# Patient Record
Sex: Female | Born: 1985 | Hispanic: No | Marital: Married | Smoking: Never smoker
Health system: Southern US, Community
[De-identification: ages and names within clinical notes are randomized; demographics above are authoritative.]

## PROBLEM LIST (undated history)

## (undated) DIAGNOSIS — Z789 Other specified health status: Secondary | ICD-10-CM

## (undated) HISTORY — PX: NO PAST SURGERIES: SHX2092

---

## 2010-06-15 ENCOUNTER — Inpatient Hospital Stay (HOSPITAL_COMMUNITY): Admission: AD | Admit: 2010-06-15 | Discharge: 2010-06-18 | Payer: Self-pay | Admitting: Obstetrics and Gynecology

## 2010-06-15 DIAGNOSIS — O4290 Premature rupture of membranes, unspecified as to length of time between rupture and onset of labor, unspecified weeks of gestation: Secondary | ICD-10-CM

## 2011-02-03 LAB — CBC
HCT: 31.7 % — ABNORMAL LOW (ref 36.0–46.0)
Hemoglobin: 10.6 g/dL — ABNORMAL LOW (ref 12.0–15.0)
Hemoglobin: 12.4 g/dL (ref 12.0–15.0)
MCH: 31.3 pg (ref 26.0–34.0)
MCH: 31.7 pg (ref 26.0–34.0)
MCHC: 33.5 g/dL (ref 30.0–36.0)
MCHC: 33.6 g/dL (ref 30.0–36.0)
MCV: 93.1 fL (ref 78.0–100.0)
Platelets: 205 10*3/uL (ref 150–400)
RBC: 3.96 MIL/uL (ref 3.87–5.11)
RDW: 13.7 % (ref 11.5–15.5)

## 2012-04-22 ENCOUNTER — Encounter: Payer: Self-pay | Admitting: Family Medicine

## 2012-04-22 ENCOUNTER — Ambulatory Visit (INDEPENDENT_AMBULATORY_CARE_PROVIDER_SITE_OTHER): Payer: BC Managed Care – PPO | Admitting: Family Medicine

## 2012-04-22 VITALS — BP 95/57 | HR 65 | Temp 98.2°F | Resp 16 | Ht 64.0 in | Wt 133.0 lb

## 2012-04-22 DIAGNOSIS — Z0000 Encounter for general adult medical examination without abnormal findings: Secondary | ICD-10-CM

## 2012-04-22 DIAGNOSIS — R229 Localized swelling, mass and lump, unspecified: Secondary | ICD-10-CM

## 2012-04-22 LAB — POCT UA - MICROSCOPIC ONLY
Casts, Ur, LPF, POC: NEGATIVE
Crystals, Ur, HPF, POC: NEGATIVE
Yeast, UA: NEGATIVE

## 2012-04-22 LAB — POCT URINALYSIS DIPSTICK
Ketones, UA: NEGATIVE
Protein, UA: NEGATIVE
Spec Grav, UA: 1.025
pH, UA: 7

## 2012-04-22 NOTE — Patient Instructions (Signed)

## 2012-04-23 ENCOUNTER — Encounter: Payer: Self-pay | Admitting: Family Medicine

## 2012-04-23 DIAGNOSIS — Z0000 Encounter for general adult medical examination without abnormal findings: Secondary | ICD-10-CM | POA: Insufficient documentation

## 2012-04-23 NOTE — Progress Notes (Signed)
Subjective:    Patient ID: Cynthia Mccoy, female    DOB: May 14, 1986, 26 y.o.   MRN: [id number]  HPI  This 26 y.o. Female is here for CPE for insurance purposes. She had PAP Feb 2013 (normal).  She takes no chronic prescription meds and PMHx is negative. 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 Daughter. She exercises 2x/ week, is a nonsmoker and nondrinker.    Review of Systems  Constitutional: Negative.   HENT: Negative.   Respiratory: Negative.   Cardiovascular: Negative.   Gastrointestinal: Negative.   Genitourinary: Negative.        Menarche- age 26  Musculoskeletal: Negative.   Skin: Negative.   Hematological: Negative.   Psychiatric/Behavioral: Negative.        Objective:   Physical Exam  Vitals reviewed. Constitutional: She is oriented to person, place, and time. She appears well-developed and well-nourished. No distress.  HENT:  Head: Normocephalic and atraumatic.  Right Ear: External ear normal.  Left Ear: External ear normal.  Nose: Nose normal.  Eyes: Conjunctivae and EOM are normal. Pupils are equal, round, and reactive to light. No scleral icterus.  Neck: Normal range of motion. Neck supple. No thyromegaly present.  Cardiovascular: Normal rate, regular rhythm, normal heart sounds and intact distal pulses.  Exam reveals no gallop and no friction rub.   No murmur heard. Pulmonary/Chest: Effort normal and breath sounds normal. No respiratory distress. She has no wheezes.  Abdominal: Soft. Bowel sounds are normal. She exhibits no mass. There is no tenderness. There is no guarding.       No organomegaly  Musculoskeletal: Normal range of motion.  Lymphadenopathy:    She has no cervical adenopathy.  Neurological: She is alert and oriented to person, place, and time. She has normal reflexes. No cranial nerve deficit. She exhibits normal muscle tone. Coordination normal.  Skin: Skin is warm and dry. No erythema.       Right forearm: 0.5 cm firm slightly  discolored nodule, nontender, no drainage, no redness  Psychiatric: She has a normal mood and affect. Her behavior is normal. Judgment and thought content normal.   Results for orders placed in visit on 04/22/12  POCT URINALYSIS DIPSTICK      Component Value Range   Color, UA yellow     Clarity, UA clear     Glucose, UA neg     Bilirubin, UA neg     Ketones, UA neg     Spec Grav, UA 1.025     Blood, UA trace     pH, UA 7.0     Protein, UA neg     Urobilinogen, UA 0.2     Nitrite, UA neg     Leukocytes, UA small (1+)    POCT UA - MICROSCOPIC ONLY      Component Value Range   WBC, Ur, HPF, POC 0-18     RBC, urine, microscopic 0-3     Bacteria, U Microscopic trace     Mucus, UA trace     Epithelial cells, urine per micros 0-4     Crystals, Ur, HPF, POC neg     Casts, Ur, LPF, POC neg     Yeast, UA neg      LABS: Oct 23/ 2012- Chemistries- normal (Cr=0.62, Gluc= 84, LFTs normal)  Total Chol= 165  TGs= 39  HDL= 92  LDL=65      Assessment & Plan:   1. Routine general medical examination at a health care facility    2. Single skin nodule - lesion may be Dermatofibroma or Epidermal cyst Ambulatory referral to Dermatology

## 2012-04-30 ENCOUNTER — Encounter: Payer: Self-pay | Admitting: Family Medicine

## 2012-11-19 NOTE — L&D Delivery Note (Signed)
Patient was C/C/2 and pushed for 10 minutes with epidural.   NSVD  female infant, Apgars 8/9, weight pending.   The patient had a second degree lacerations repaired with 2-0 vicryl. Fundus was firm. EBL was expected. Placenta was delivered intact. Vagina was clear.  Baby was vigorous to bedside.  Cynthia Mccoy

## 2013-01-17 ENCOUNTER — Ambulatory Visit (INDEPENDENT_AMBULATORY_CARE_PROVIDER_SITE_OTHER): Payer: BC Managed Care – PPO | Admitting: Internal Medicine

## 2013-01-17 VITALS — BP 101/66 | HR 99 | Temp 98.6°F | Resp 16

## 2013-01-17 DIAGNOSIS — J029 Acute pharyngitis, unspecified: Secondary | ICD-10-CM

## 2013-01-17 DIAGNOSIS — Z3490 Encounter for supervision of normal pregnancy, unspecified, unspecified trimester: Secondary | ICD-10-CM

## 2013-01-17 DIAGNOSIS — Z331 Pregnant state, incidental: Secondary | ICD-10-CM

## 2013-01-17 NOTE — Progress Notes (Signed)
  Subjective:    Patient ID: Cynthia Mccoy, female    DOB: July 22, 1986, 27 y.o.   MRN: [id number]  HPI C/o sorethroat and low grade fever for 4 d, getting better. Her sister lives with her and had strep neg sore throat. No other sxs.   Review of Systems [redacted] weeks pregnant    Objective:   Physical Exam  Vitals reviewed. Constitutional: She is oriented to person, place, and time. She appears well-developed and well-nourished. No distress.  HENT:  Right Ear: External ear normal.  Left Ear: External ear normal.  Nose: Nose normal.  Mouth/Throat: Oropharynx is clear and moist.  Eyes: EOM are normal.  Neck: Normal range of motion.  Pulmonary/Chest: Effort normal.  Lymphadenopathy:    She has no cervical adenopathy.  Neurological: She is alert and oriented to person, place, and time.  Psychiatric: She has a normal mood and affect.   Appears well Results for orders placed in visit on 01/17/13  POCT RAPID STREP A (OFFICE)      Result Value Range   Rapid Strep A Screen Negative  Negative         Assessment & Plan:  Pharyngitis

## 2013-01-17 NOTE — Patient Instructions (Addendum)
Viral Pharyngitis  Viral pharyngitis is a viral infection that produces redness, pain, and swelling (inflammation) of the throat. It can spread from person to person (contagious).  CAUSES  Viral pharyngitis is caused by inhaling a large amount of certain germs called viruses. Many different viruses cause viral pharyngitis.  SYMPTOMS  Symptoms of viral pharyngitis include:   Sore throat.   Tiredness.   Stuffy nose.   Low-grade fever.   Congestion.   Cough.  TREATMENT  Treatment includes rest, drinking plenty of fluids, and the use of over-the-counter medication (approved by your caregiver).  HOME CARE INSTRUCTIONS    Drink enough fluids to keep your urine clear or pale yellow.   Eat soft, cold foods such as ice cream, frozen ice pops, or gelatin dessert.   Gargle with warm salt water (1 tsp salt per 1 qt of water).   If over age 7, throat lozenges may be used safely.   Only take over-the-counter or prescription medicines for pain, discomfort, or fever as directed by your caregiver. Do not take aspirin.  To help prevent spreading viral pharyngitis to others, avoid:   Mouth-to-mouth contact with others.   Sharing utensils for eating and drinking.   Coughing around others.  SEEK MEDICAL CARE IF:    You are better in a few days, then become worse.   You have a fever or pain not helped by pain medicines.   There are any other changes that concern you.  Document Released: 08/15/2005 Document Revised: 01/28/2012 Document Reviewed: 01/11/2011  ExitCare Patient Information 2013 [employer].

## 2013-05-08 LAB — OB RESULTS CONSOLE GC/CHLAMYDIA
Chlamydia: NEGATIVE
Gonorrhea: NEGATIVE

## 2013-05-08 LAB — OB RESULTS CONSOLE RUBELLA ANTIBODY, IGM: Rubella: IMMUNE

## 2013-05-08 LAB — OB RESULTS CONSOLE HIV ANTIBODY (ROUTINE TESTING): HIV: NONREACTIVE

## 2013-07-13 ENCOUNTER — Encounter (HOSPITAL_COMMUNITY): Payer: Self-pay | Admitting: *Deleted

## 2013-07-13 ENCOUNTER — Inpatient Hospital Stay (HOSPITAL_COMMUNITY): Payer: BC Managed Care – PPO | Admitting: Anesthesiology

## 2013-07-13 ENCOUNTER — Inpatient Hospital Stay (HOSPITAL_COMMUNITY)
Admission: AD | Admit: 2013-07-13 | Discharge: 2013-07-14 | DRG: 373 | Disposition: A | Discharge status: Home / Self Care | Payer: BC Managed Care – PPO | Source: Ambulatory Visit | Attending: Obstetrics and Gynecology | Admitting: Obstetrics and Gynecology

## 2013-07-13 ENCOUNTER — Encounter (HOSPITAL_COMMUNITY): Payer: Self-pay | Admitting: Anesthesiology

## 2013-07-13 LAB — CBC
HCT: 35.3 % — ABNORMAL LOW (ref 36.0–46.0)
Hemoglobin: 11.9 g/dL — ABNORMAL LOW (ref 12.0–15.0)
MCV: 84 fL (ref 78.0–100.0)
Platelets: 195 10*3/uL (ref 150–400)
RBC: 4.2 MIL/uL (ref 3.87–5.11)
WBC: 14.7 10*3/uL — ABNORMAL HIGH (ref 4.0–10.5)

## 2013-07-13 MED ORDER — ONDANSETRON HCL 4 MG/2ML IJ SOLN: 4.0000 mg | INTRAMUSCULAR | Status: DC | PRN

## 2013-07-13 MED ORDER — EPHEDRINE 5 MG/ML INJ
10.0000 mg | INTRAVENOUS | Status: DC | PRN
  Filled 2013-07-13: qty 2

## 2013-07-13 MED ORDER — BENZOCAINE-MENTHOL 20-0.5 % EX AERO
1.0000 "application " | INHALATION_SPRAY | CUTANEOUS | Status: DC | PRN
  Filled 2013-07-13: qty 56

## 2013-07-13 MED ORDER — ACETAMINOPHEN 325 MG PO TABS: 650.0000 mg | ORAL_TABLET | ORAL | Status: DC | PRN

## 2013-07-13 MED ORDER — FLEET ENEMA 7-19 GM/118ML RE ENEM: 1.0000 | ENEMA | RECTAL | Status: DC | PRN

## 2013-07-13 MED ORDER — ONDANSETRON HCL 4 MG PO TABS: 4.0000 mg | ORAL_TABLET | ORAL | Status: DC | PRN

## 2013-07-13 MED ORDER — LACTATED RINGERS IV SOLN: 500.0000 mL | Freq: Once | INTRAVENOUS | Status: DC

## 2013-07-13 MED ORDER — ZOLPIDEM TARTRATE 5 MG PO TABS: 5.0000 mg | ORAL_TABLET | Freq: Every evening | ORAL | Status: DC | PRN

## 2013-07-13 MED ORDER — EPHEDRINE 5 MG/ML INJ
10.0000 mg | INTRAVENOUS | Status: DC | PRN
  Filled 2013-07-13: qty 2
  Filled 2013-07-13: qty 4

## 2013-07-13 MED ORDER — LANOLIN HYDROUS EX OINT: TOPICAL_OINTMENT | CUTANEOUS | Status: DC | PRN

## 2013-07-13 MED ORDER — PHENYLEPHRINE 40 MCG/ML (10ML) SYRINGE FOR IV PUSH (FOR BLOOD PRESSURE SUPPORT)
80.0000 ug | PREFILLED_SYRINGE | INTRAVENOUS | Status: DC | PRN
  Filled 2013-07-13: qty 2

## 2013-07-13 MED ORDER — LIDOCAINE HCL (PF) 1 % IJ SOLN
30.0000 mL | INTRAMUSCULAR | Status: DC | PRN
  Filled 2013-07-13: qty 30

## 2013-07-13 MED ORDER — LIDOCAINE HCL (PF) 1 % IJ SOLN
INTRAMUSCULAR | Status: DC | PRN
  Administered 2013-07-13 (×2): 9 mL

## 2013-07-13 MED ORDER — LACTATED RINGERS IV SOLN: INTRAVENOUS | Status: DC

## 2013-07-13 MED ORDER — CITRIC ACID-SODIUM CITRATE 334-500 MG/5ML PO SOLN: 30.0000 mL | ORAL | Status: DC | PRN

## 2013-07-13 MED ORDER — OXYCODONE-ACETAMINOPHEN 5-325 MG PO TABS: 1.0000 | ORAL_TABLET | ORAL | Status: DC | PRN

## 2013-07-13 MED ORDER — WITCH HAZEL-GLYCERIN EX PADS: 1.0000 "application " | MEDICATED_PAD | CUTANEOUS | Status: DC | PRN

## 2013-07-13 MED ORDER — LACTATED RINGERS IV SOLN: 500.0000 mL | INTRAVENOUS | Status: DC | PRN

## 2013-07-13 MED ORDER — FENTANYL 2.5 MCG/ML BUPIVACAINE 1/10 % EPIDURAL INFUSION (WH - ANES)
14.0000 mL/h | INTRAMUSCULAR | Status: DC | PRN
  Filled 2013-07-13: qty 125

## 2013-07-13 MED ORDER — DIPHENHYDRAMINE HCL 25 MG PO CAPS: 25.0000 mg | ORAL_CAPSULE | Freq: Four times a day (QID) | ORAL | Status: DC | PRN

## 2013-07-13 MED ORDER — PHENYLEPHRINE 40 MCG/ML (10ML) SYRINGE FOR IV PUSH (FOR BLOOD PRESSURE SUPPORT)
80.0000 ug | PREFILLED_SYRINGE | INTRAVENOUS | Status: DC | PRN
  Filled 2013-07-13: qty 5
  Filled 2013-07-13: qty 2

## 2013-07-13 MED ORDER — ONDANSETRON HCL 4 MG/2ML IJ SOLN: 4.0000 mg | Freq: Four times a day (QID) | INTRAMUSCULAR | Status: DC | PRN

## 2013-07-13 MED ORDER — FENTANYL 2.5 MCG/ML BUPIVACAINE 1/10 % EPIDURAL INFUSION (WH - ANES)
INTRAMUSCULAR | Status: DC | PRN
  Administered 2013-07-13: 14 mL/h via EPIDURAL

## 2013-07-13 MED ORDER — PRENATAL MULTIVITAMIN CH
1.0000 | ORAL_TABLET | Freq: Every day | ORAL | Status: DC
  Filled 2013-07-13: qty 1

## 2013-07-13 MED ORDER — OXYTOCIN BOLUS FROM INFUSION: 500.0000 mL | INTRAVENOUS | Status: DC

## 2013-07-13 MED ORDER — DIBUCAINE 1 % RE OINT
1.0000 "application " | TOPICAL_OINTMENT | RECTAL | Status: DC | PRN
  Filled 2013-07-13: qty 28

## 2013-07-13 MED ORDER — TETANUS-DIPHTH-ACELL PERTUSSIS 5-2.5-18.5 LF-MCG/0.5 IM SUSP
0.5000 mL | Freq: Once | INTRAMUSCULAR | Status: DC
  Filled 2013-07-13: qty 0.5

## 2013-07-13 MED ORDER — SENNOSIDES-DOCUSATE SODIUM 8.6-50 MG PO TABS
2.0000 | ORAL_TABLET | Freq: Every day | ORAL | Status: DC
Start: 2013-07-13 — End: 2013-07-14
  Administered 2013-07-13: 2 via ORAL

## 2013-07-13 MED ORDER — IBUPROFEN 600 MG PO TABS: 600.0000 mg | ORAL_TABLET | Freq: Four times a day (QID) | ORAL | Status: DC | PRN

## 2013-07-13 MED ORDER — IBUPROFEN 600 MG PO TABS
600.0000 mg | ORAL_TABLET | Freq: Four times a day (QID) | ORAL | Status: DC
  Administered 2013-07-13 – 2013-07-14 (×4): 600 mg via ORAL
  Filled 2013-07-13 (×4): qty 1

## 2013-07-13 MED ORDER — SIMETHICONE 80 MG PO CHEW: 80.0000 mg | CHEWABLE_TABLET | ORAL | Status: DC | PRN

## 2013-07-13 MED ORDER — DIPHENHYDRAMINE HCL 50 MG/ML IJ SOLN: 12.5000 mg | INTRAMUSCULAR | Status: DC | PRN

## 2013-07-13 MED ORDER — OXYTOCIN 40 UNITS IN LACTATED RINGERS INFUSION - SIMPLE MED
62.5000 mL/h | INTRAVENOUS | Status: DC
  Filled 2013-07-13: qty 1000

## 2013-07-13 NOTE — Anesthesia Procedure Notes (Signed)
Epidural Patient location during procedure: OB Start time: 07/13/2013 11:20 AM End time: 07/13/2013 11:25 AM  Staffing Anesthesiologist: Sandrea Hughs Performed by: anesthesiologist   Preanesthetic Checklist Completed: patient identified, surgical consent, pre-op evaluation, timeout performed, IV checked, risks and benefits discussed and monitors and equipment checked  Epidural Patient position: sitting Prep: site prepped and draped and DuraPrep Patient monitoring: continuous pulse ox and blood pressure Approach: midline Injection technique: LOR air  Needle:  Needle type: Tuohy  Needle gauge: 17 G Needle length: 9 cm and 9 Needle insertion depth: 6 cm Catheter type: closed end flexible Catheter size: 19 Gauge Catheter at skin depth: 11 cm Test dose: negative and Other  Assessment Sensory level: T9 Events: blood not aspirated, injection not painful, no injection resistance, negative IV test and no paresthesia  Additional Notes Reason for block:procedure for pain

## 2013-07-13 NOTE — H&P (Signed)
27 y.o. [redacted]w[redacted]d  G2P1001 comes in c/o labor.  Otherwise has good fetal movement and no bleeding.  History reviewed. No pertinent past medical history. History reviewed. No pertinent past surgical history.  OB History  Gravida Para Term Preterm AB SAB TAB Ectopic Multiple Living  2 1 1  0 0 0 0 0 0 1    # Outcome Date GA Lbr Len/2nd Weight Sex Delivery Anes PTL Lv  2 CUR           1 TRM 06/16/10    F SVD EPI N Y      History   Social History  . Marital Status: Married    Spouse Name: N/A    Number of Children: 1  . Years of Education: 16   Occupational History  . 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 Smoking status: Never Smoker   . Smokeless tobacco: Not on file  . Alcohol Use: No  . Drug Use: No  . Sexual Activity: Yes    Birth Control/ Protection: None   Other Topics Concern  . Not on file   Social History Narrative  . No narrative on file   Review of patient's allergies indicates no known allergies.    Prenatal Transfer Tool  Maternal Diabetes: No Genetic Screening: Normal Maternal Ultrasounds/Referrals: Normal Fetal Ultrasounds or other Referrals:  None Maternal Substance Abuse:  No Significant Maternal Medications:  None Significant Maternal Lab Results: Lab values include: Group B Strep negative  Other PNC: uncomplicated    Filed Vitals:   07/13/13 1307  BP: 121/84  Pulse: 69  Temp:   Resp: 16     Lungs/Cor:  NAD Abdomen:  soft, gravid Ex:  no cords, erythema SVE:  5/100 per MAU FHTs:  145, good STV, NST R Toco:  q2-5   A/P   Admit to L&D for labor  GBS neg  Epidural  Routine care  [place], Luther Parody

## 2013-07-13 NOTE — Progress Notes (Signed)
Dr Claiborne Billings notified of pt's VE, FHR pattern, contraction pattern orders received to give pt a choice to ambulate and recheck cervix or go home until contraction get worse.

## 2013-07-13 NOTE — Anesthesia Preprocedure Evaluation (Signed)
Anesthesia Evaluation  Patient identified by MRN, date of birth, ID band Patient awake    Reviewed: Allergy & Precautions, H&P , NPO status , Patient's Chart, lab work & pertinent test results  Airway Mallampati: I TM Distance: >3 FB Neck ROM: full    Dental no notable dental hx.    Pulmonary neg pulmonary ROS,    Pulmonary exam normal       Cardiovascular negative cardio ROS      Neuro/Psych negative neurological ROS  negative psych ROS   GI/Hepatic negative GI ROS, Neg liver ROS,   Endo/Other  negative endocrine ROS  Renal/GU negative Renal ROS  negative genitourinary   Musculoskeletal negative musculoskeletal ROS (+)   Abdominal Normal abdominal exam  (+)   Peds  Hematology negative hematology ROS (+)   Anesthesia Other Findings   Reproductive/Obstetrics (+) Pregnancy                           Anesthesia Physical  Anesthesia Plan  ASA: II  Anesthesia Plan: Epidural   Post-op Pain Management:    Induction:   Airway Management Planned:   Additional Equipment:   Intra-op Plan:   Post-operative Plan:   Informed Consent: I have reviewed the patients History and Physical, chart, labs and discussed the procedure including the risks, benefits and alternatives for the proposed anesthesia with the patient or authorized representative who has indicated his/her understanding and acceptance.     Plan Discussed with:   Anesthesia Plan Comments:         Anesthesia Quick Evaluation  

## 2013-07-13 NOTE — MAU Note (Signed)
Pt presents with complaints of contractions that started in there middle of the night and have gotten worse this morning and are now 5 mins apart and she noticed some bloody show. States baby is active

## 2013-07-13 NOTE — MAU Note (Signed)
Pt states ctx's q4-5 minutes apart, notes bloody show.

## 2013-07-14 LAB — CBC
Hemoglobin: 10.2 g/dL — ABNORMAL LOW (ref 12.0–15.0)
MCHC: 32.2 g/dL (ref 30.0–36.0)
RDW: 14.7 % (ref 11.5–15.5)
WBC: 11.8 10*3/uL — ABNORMAL HIGH (ref 4.0–10.5)

## 2013-07-14 MED ORDER — OXYCODONE-ACETAMINOPHEN 5-325 MG PO TABS: 2.0000 | ORAL_TABLET | ORAL | Status: DC | PRN

## 2013-07-14 MED ORDER — DOCUSATE SODIUM 100 MG PO CAPS: 100.0000 mg | ORAL_CAPSULE | Freq: Two times a day (BID) | ORAL | Status: DC

## 2013-07-14 MED ORDER — IBUPROFEN 600 MG PO TABS: 600.0000 mg | ORAL_TABLET | Freq: Four times a day (QID) | ORAL | Status: DC | PRN

## 2013-07-14 NOTE — Progress Notes (Signed)
Post Partum Day 1 Subjective: no complaints, up ad lib, voiding and tolerating PO  Objective: Blood pressure 101/66, pulse 68, temperature 98.2 F (36.8 C), temperature source Oral, resp. rate 18, height 5\' 5"  (1.651 m), weight 75.297 kg (166 lb), last menstrual period 10/17/2012, SpO2 99.00%, unknown if currently breastfeeding.  Physical Exam:  General: alert, cooperative and appears stated age Lochia: appropriate Uterine Fundus: firm  Recent Labs  07/13/13 1030 07/14/13 0605  HGB 11.9* 10.2*  HCT 35.3* 31.7*    Assessment/Plan: Discharge home, Breastfeeding and Circumcision prior to discharge R/B/A reviewed, will proceed   LOS: 1 day   Gladyes Kudo H. 07/14/2013, 8:53 AM

## 2013-07-14 NOTE — Progress Notes (Signed)
  Mother called to make sure the infant's umbilical cord gets cut off. They were a early DC today but I forgot to take the clamp off. Mother stated that she had a ped appt tomorrow for a weight check anyway. I specifically told her to make sure they take the umbilical cord off in the office. She verbalized understanding.

## 2013-07-14 NOTE — Anesthesia Postprocedure Evaluation (Signed)
Anesthesia Post Note  Patient: Cynthia Mccoy  Procedure(s) Performed: * No procedures listed *  Anesthesia type: Epidural  Patient location: Mother/Baby  Post pain: Pain level controlled  Post assessment: Post-op Vital signs reviewed  Last Vitals:  Filed Vitals:   07/14/13 0600  BP: 101/66  Pulse: 68  Temp: 36.8 C  Resp: 18    Post vital signs: Reviewed  Level of consciousness:alert  Complications: No apparent anesthesia complications

## 2013-07-23 NOTE — Discharge Summary (Signed)
Obstetric Discharge Summary Reason for Admission: onset of labor Prenatal Procedures: ultrasound Intrapartum Procedures: spontaneous vaginal delivery Postpartum Procedures: none Complications-Operative and Postpartum: none Hemoglobin  Date Value Range Status  07/14/2013 10.2* 12.0 - 15.0 g/dL Final     HCT  Date Value Range Status  07/14/2013 31.7* 36.0 - 46.0 % Final    Physical Exam:  General: alert, cooperative and appears stated age 27: appropriate Uterine Fundus: firm  Discharge Diagnoses: Term Pregnancy-delivered  Discharge Information: Date: 07/23/2013 Activity: pelvic rest Diet: routine Medications: Ibuprofen, Colace and Percocet Condition: improved Instructions: refer to practice specific booklet Discharge to: home   Newborn Data: Live born female  Birth Weight: 7 lb 13 oz (3544 g) APGAR: 9, 9  Home with mother.  Derrel Moore H. 07/23/2013, 12:49 PM

## 2014-09-20 ENCOUNTER — Encounter (HOSPITAL_COMMUNITY): Payer: Self-pay | Admitting: *Deleted

## 2014-11-19 NOTE — L&D Delivery Note (Signed)
Delivery Note  SVD viable female Apgars 9,9 over intact perineum.  Placenta delivered spontaneously intact with 3VC. good support and hemostasis noted and R/V exam confirms.  PH art was sent.  [place] cord blood was not done.  No obvious fetal anomalies noticed at delivery.  Mother and baby were doing well.  EBL 100cc  Candice Camp, MD

## 2014-12-23 LAB — OB RESULTS CONSOLE RUBELLA ANTIBODY, IGM: Rubella: NON-IMMUNE/NOT IMMUNE

## 2014-12-23 LAB — OB RESULTS CONSOLE ABO/RH: RH TYPE: POSITIVE

## 2014-12-23 LAB — OB RESULTS CONSOLE GC/CHLAMYDIA
Chlamydia: NEGATIVE
GC PROBE AMP, GENITAL: NEGATIVE

## 2014-12-23 LAB — OB RESULTS CONSOLE RPR: RPR: NONREACTIVE

## 2014-12-23 LAB — OB RESULTS CONSOLE HIV ANTIBODY (ROUTINE TESTING): HIV: NONREACTIVE

## 2014-12-23 LAB — OB RESULTS CONSOLE ANTIBODY SCREEN: ANTIBODY SCREEN: NEGATIVE

## 2014-12-23 LAB — OB RESULTS CONSOLE HEPATITIS B SURFACE ANTIGEN: Hepatitis B Surface Ag: NEGATIVE

## 2015-03-01 ENCOUNTER — Other Ambulatory Visit (HOSPITAL_COMMUNITY): Payer: Self-pay | Admitting: Obstetrics and Gynecology

## 2015-03-01 DIAGNOSIS — Z3A20 20 weeks gestation of pregnancy: Secondary | ICD-10-CM

## 2015-03-01 DIAGNOSIS — R9389 Abnormal findings on diagnostic imaging of other specified body structures: Secondary | ICD-10-CM

## 2015-03-04 ENCOUNTER — Encounter (HOSPITAL_COMMUNITY): Payer: Self-pay

## 2015-03-04 ENCOUNTER — Other Ambulatory Visit (HOSPITAL_COMMUNITY): Payer: Self-pay | Admitting: Obstetrics and Gynecology

## 2015-03-04 ENCOUNTER — Other Ambulatory Visit (HOSPITAL_COMMUNITY): Payer: Self-pay | Admitting: Maternal and Fetal Medicine

## 2015-03-04 ENCOUNTER — Ambulatory Visit (HOSPITAL_COMMUNITY)
Admission: RE | Admit: 2015-03-04 | Discharge: 2015-03-04 | Disposition: A | Discharge status: Home / Self Care | Payer: Managed Care, Other (non HMO) | Source: Ambulatory Visit | Attending: Obstetrics and Gynecology | Admitting: Obstetrics and Gynecology

## 2015-03-04 DIAGNOSIS — Z3A20 20 weeks gestation of pregnancy: Secondary | ICD-10-CM | POA: Insufficient documentation

## 2015-03-04 DIAGNOSIS — O358XX0 Maternal care for other (suspected) fetal abnormality and damage, not applicable or unspecified: Secondary | ICD-10-CM | POA: Insufficient documentation

## 2015-03-04 DIAGNOSIS — Z36 Encounter for antenatal screening of mother: Secondary | ICD-10-CM | POA: Insufficient documentation

## 2015-03-04 DIAGNOSIS — R9389 Abnormal findings on diagnostic imaging of other specified body structures: Secondary | ICD-10-CM

## 2015-03-04 DIAGNOSIS — Z3A23 23 weeks gestation of pregnancy: Secondary | ICD-10-CM

## 2015-03-24 ENCOUNTER — Ambulatory Visit (HOSPITAL_COMMUNITY)
Admission: RE | Admit: 2015-03-24 | Discharge: 2015-03-24 | Disposition: A | Discharge status: Home / Self Care | Payer: Managed Care, Other (non HMO) | Source: Ambulatory Visit | Attending: Obstetrics and Gynecology | Admitting: Obstetrics and Gynecology

## 2015-03-24 ENCOUNTER — Encounter (HOSPITAL_COMMUNITY): Payer: Self-pay

## 2015-03-24 ENCOUNTER — Other Ambulatory Visit (HOSPITAL_COMMUNITY): Payer: Self-pay | Admitting: Maternal and Fetal Medicine

## 2015-03-24 DIAGNOSIS — O358XX0 Maternal care for other (suspected) fetal abnormality and damage, not applicable or unspecified: Secondary | ICD-10-CM | POA: Insufficient documentation

## 2015-03-24 DIAGNOSIS — Z3A23 23 weeks gestation of pregnancy: Secondary | ICD-10-CM

## 2015-03-24 DIAGNOSIS — R9389 Abnormal findings on diagnostic imaging of other specified body structures: Secondary | ICD-10-CM

## 2015-03-24 DIAGNOSIS — Z3A22 22 weeks gestation of pregnancy: Secondary | ICD-10-CM | POA: Insufficient documentation

## 2015-03-24 HISTORY — DX: Other specified health status: Z78.9

## 2015-04-15 ENCOUNTER — Ambulatory Visit (HOSPITAL_COMMUNITY)
Admission: RE | Admit: 2015-04-15 | Discharge: 2015-04-15 | Disposition: A | Discharge status: Home / Self Care | Payer: Managed Care, Other (non HMO) | Source: Ambulatory Visit | Attending: Obstetrics and Gynecology | Admitting: Obstetrics and Gynecology

## 2015-04-15 ENCOUNTER — Other Ambulatory Visit (HOSPITAL_COMMUNITY): Payer: Self-pay | Admitting: Maternal and Fetal Medicine

## 2015-04-15 ENCOUNTER — Encounter (HOSPITAL_COMMUNITY): Payer: Self-pay

## 2015-04-15 DIAGNOSIS — Z3A26 26 weeks gestation of pregnancy: Secondary | ICD-10-CM | POA: Diagnosis not present

## 2015-04-15 DIAGNOSIS — R9389 Abnormal findings on diagnostic imaging of other specified body structures: Secondary | ICD-10-CM

## 2015-04-15 DIAGNOSIS — O358XX0 Maternal care for other (suspected) fetal abnormality and damage, not applicable or unspecified: Secondary | ICD-10-CM | POA: Insufficient documentation

## 2015-05-13 ENCOUNTER — Ambulatory Visit (HOSPITAL_COMMUNITY): Payer: Managed Care, Other (non HMO)

## 2015-05-20 ENCOUNTER — Ambulatory Visit (HOSPITAL_COMMUNITY): Payer: Managed Care, Other (non HMO)

## 2015-05-25 ENCOUNTER — Ambulatory Visit (HOSPITAL_COMMUNITY)
Admission: RE | Admit: 2015-05-25 | Discharge: 2015-05-25 | Disposition: A | Discharge status: Home / Self Care | Payer: Managed Care, Other (non HMO) | Source: Ambulatory Visit | Attending: Maternal and Fetal Medicine | Admitting: Maternal and Fetal Medicine

## 2015-05-25 ENCOUNTER — Other Ambulatory Visit (HOSPITAL_COMMUNITY): Payer: Self-pay | Admitting: Maternal and Fetal Medicine

## 2015-05-25 ENCOUNTER — Encounter (HOSPITAL_COMMUNITY): Payer: Self-pay

## 2015-05-25 DIAGNOSIS — O359XX0 Maternal care for (suspected) fetal abnormality and damage, unspecified, not applicable or unspecified: Secondary | ICD-10-CM | POA: Insufficient documentation

## 2015-05-25 DIAGNOSIS — R9389 Abnormal findings on diagnostic imaging of other specified body structures: Secondary | ICD-10-CM

## 2015-05-25 DIAGNOSIS — Z3A31 31 weeks gestation of pregnancy: Secondary | ICD-10-CM | POA: Insufficient documentation

## 2015-05-25 DIAGNOSIS — O358XX0 Maternal care for other (suspected) fetal abnormality and damage, not applicable or unspecified: Secondary | ICD-10-CM | POA: Insufficient documentation

## 2015-06-23 ENCOUNTER — Ambulatory Visit (HOSPITAL_COMMUNITY)
Admission: RE | Admit: 2015-06-23 | Discharge: 2015-06-23 | Disposition: A | Discharge status: Home / Self Care | Payer: Managed Care, Other (non HMO) | Source: Ambulatory Visit | Attending: Obstetrics and Gynecology | Admitting: Obstetrics and Gynecology

## 2015-06-23 ENCOUNTER — Encounter (HOSPITAL_COMMUNITY): Payer: Self-pay

## 2015-06-23 DIAGNOSIS — O284 Abnormal radiological finding on antenatal screening of mother: Secondary | ICD-10-CM | POA: Diagnosis not present

## 2015-06-23 DIAGNOSIS — R9389 Abnormal findings on diagnostic imaging of other specified body structures: Secondary | ICD-10-CM

## 2015-06-24 LAB — OB RESULTS CONSOLE GBS: STREP GROUP B AG: NEGATIVE

## 2015-07-20 ENCOUNTER — Encounter (HOSPITAL_COMMUNITY): Payer: Self-pay | Admitting: *Deleted

## 2015-07-20 ENCOUNTER — Inpatient Hospital Stay (HOSPITAL_COMMUNITY): Payer: Managed Care, Other (non HMO) | Admitting: Anesthesiology

## 2015-07-20 ENCOUNTER — Inpatient Hospital Stay (HOSPITAL_COMMUNITY)
Admission: AD | Admit: 2015-07-20 | Discharge: 2015-07-22 | DRG: 775 | Disposition: A | Discharge status: Home / Self Care | Payer: Managed Care, Other (non HMO) | Source: Ambulatory Visit | Attending: Obstetrics and Gynecology | Admitting: Obstetrics and Gynecology

## 2015-07-20 DIAGNOSIS — IMO0001 Reserved for inherently not codable concepts without codable children: Secondary | ICD-10-CM

## 2015-07-20 DIAGNOSIS — Z8249 Family history of ischemic heart disease and other diseases of the circulatory system: Secondary | ICD-10-CM | POA: Diagnosis not present

## 2015-07-20 DIAGNOSIS — O9972 Diseases of the skin and subcutaneous tissue complicating childbirth: Secondary | ICD-10-CM | POA: Diagnosis present

## 2015-07-20 DIAGNOSIS — R221 Localized swelling, mass and lump, neck: Secondary | ICD-10-CM | POA: Diagnosis present

## 2015-07-20 DIAGNOSIS — Z833 Family history of diabetes mellitus: Secondary | ICD-10-CM

## 2015-07-20 DIAGNOSIS — O3660X0 Maternal care for excessive fetal growth, unspecified trimester, not applicable or unspecified: Secondary | ICD-10-CM | POA: Diagnosis present

## 2015-07-20 DIAGNOSIS — Z809 Family history of malignant neoplasm, unspecified: Secondary | ICD-10-CM

## 2015-07-20 DIAGNOSIS — Z3A00 Weeks of gestation of pregnancy not specified: Secondary | ICD-10-CM | POA: Diagnosis present

## 2015-07-20 LAB — CBC
HCT: 38.6 % (ref 36.0–46.0)
Hemoglobin: 13 g/dL (ref 12.0–15.0)
MCH: 29.9 pg (ref 26.0–34.0)
MCHC: 33.7 g/dL (ref 30.0–36.0)
MCV: 88.7 fL (ref 78.0–100.0)
PLATELETS: 195 10*3/uL (ref 150–400)
RBC: 4.35 MIL/uL (ref 3.87–5.11)
RDW: 14.4 % (ref 11.5–15.5)
WBC: 12.9 10*3/uL — AB (ref 4.0–10.5)

## 2015-07-20 LAB — TYPE AND SCREEN
ABO/RH(D): B POS
ANTIBODY SCREEN: NEGATIVE

## 2015-07-20 LAB — ABO/RH: ABO/RH(D): B POS

## 2015-07-20 MED ORDER — LACTATED RINGERS IV SOLN
500.0000 mL | INTRAVENOUS | Status: DC | PRN
  Administered 2015-07-20: 500 mL via INTRAVENOUS

## 2015-07-20 MED ORDER — WITCH HAZEL-GLYCERIN EX PADS
1.0000 "application " | MEDICATED_PAD | CUTANEOUS | Status: DC | PRN
  Administered 2015-07-21: 1 via TOPICAL

## 2015-07-20 MED ORDER — ACETAMINOPHEN 325 MG PO TABS: 650.0000 mg | ORAL_TABLET | ORAL | Status: DC | PRN

## 2015-07-20 MED ORDER — PRENATAL MULTIVITAMIN CH
1.0000 | ORAL_TABLET | Freq: Every day | ORAL | Status: DC
  Filled 2015-07-20: qty 1

## 2015-07-20 MED ORDER — TETANUS-DIPHTH-ACELL PERTUSSIS 5-2.5-18.5 LF-MCG/0.5 IM SUSP: 0.5000 mL | Freq: Once | INTRAMUSCULAR | Status: DC

## 2015-07-20 MED ORDER — FENTANYL 2.5 MCG/ML BUPIVACAINE 1/10 % EPIDURAL INFUSION (WH - ANES)
14.0000 mL/h | INTRAMUSCULAR | Status: DC | PRN
  Administered 2015-07-20: 14 mL/h via EPIDURAL
  Filled 2015-07-20: qty 125

## 2015-07-20 MED ORDER — LANOLIN HYDROUS EX OINT: TOPICAL_OINTMENT | CUTANEOUS | Status: DC | PRN

## 2015-07-20 MED ORDER — LACTATED RINGERS IV SOLN
INTRAVENOUS | Status: DC
  Administered 2015-07-20: 19:00:00 via INTRAVENOUS

## 2015-07-20 MED ORDER — ONDANSETRON HCL 4 MG PO TABS
4.0000 mg | ORAL_TABLET | ORAL | Status: DC | PRN
Start: 2015-07-20 — End: 2015-07-22

## 2015-07-20 MED ORDER — SIMETHICONE 80 MG PO CHEW: 80.0000 mg | CHEWABLE_TABLET | ORAL | Status: DC | PRN

## 2015-07-20 MED ORDER — SENNOSIDES-DOCUSATE SODIUM 8.6-50 MG PO TABS
2.0000 | ORAL_TABLET | ORAL | Status: DC
  Administered 2015-07-22: 2 via ORAL
  Filled 2015-07-20: qty 2

## 2015-07-20 MED ORDER — DIPHENHYDRAMINE HCL 50 MG/ML IJ SOLN: 12.5000 mg | INTRAMUSCULAR | Status: DC | PRN

## 2015-07-20 MED ORDER — OXYCODONE-ACETAMINOPHEN 5-325 MG PO TABS: 1.0000 | ORAL_TABLET | ORAL | Status: DC | PRN

## 2015-07-20 MED ORDER — LIDOCAINE HCL (PF) 1 % IJ SOLN
30.0000 mL | INTRAMUSCULAR | Status: DC | PRN
  Filled 2015-07-20: qty 30

## 2015-07-20 MED ORDER — BUTORPHANOL TARTRATE 1 MG/ML IJ SOLN: 1.0000 mg | Freq: Once | INTRAMUSCULAR | Status: DC

## 2015-07-20 MED ORDER — ZOLPIDEM TARTRATE 5 MG PO TABS: 5.0000 mg | ORAL_TABLET | Freq: Every evening | ORAL | Status: DC | PRN

## 2015-07-20 MED ORDER — OXYTOCIN BOLUS FROM INFUSION
500.0000 mL | INTRAVENOUS | Status: DC
  Administered 2015-07-20: 500 mL via INTRAVENOUS

## 2015-07-20 MED ORDER — PHENYLEPHRINE 40 MCG/ML (10ML) SYRINGE FOR IV PUSH (FOR BLOOD PRESSURE SUPPORT)
80.0000 ug | PREFILLED_SYRINGE | INTRAVENOUS | Status: DC | PRN
  Filled 2015-07-20: qty 20
  Filled 2015-07-20: qty 2

## 2015-07-20 MED ORDER — MEDROXYPROGESTERONE ACETATE 150 MG/ML IM SUSP: 150.0000 mg | INTRAMUSCULAR | Status: DC | PRN

## 2015-07-20 MED ORDER — EPHEDRINE 5 MG/ML INJ
10.0000 mg | INTRAVENOUS | Status: DC | PRN
  Filled 2015-07-20: qty 2

## 2015-07-20 MED ORDER — MEASLES, MUMPS & RUBELLA VAC ~~LOC~~ INJ
0.5000 mL | INJECTION | Freq: Once | SUBCUTANEOUS | Status: DC
  Filled 2015-07-20: qty 0.5

## 2015-07-20 MED ORDER — BENZOCAINE-MENTHOL 20-0.5 % EX AERO
1.0000 "application " | INHALATION_SPRAY | CUTANEOUS | Status: DC | PRN
  Administered 2015-07-21: 1 via TOPICAL
  Filled 2015-07-20: qty 56

## 2015-07-20 MED ORDER — PROMETHAZINE HCL 25 MG/ML IJ SOLN: 12.5000 mg | Freq: Once | INTRAMUSCULAR | Status: DC

## 2015-07-20 MED ORDER — IBUPROFEN 600 MG PO TABS
600.0000 mg | ORAL_TABLET | Freq: Four times a day (QID) | ORAL | Status: DC
  Administered 2015-07-20 – 2015-07-22 (×6): 600 mg via ORAL
  Filled 2015-07-20 (×5): qty 1

## 2015-07-20 MED ORDER — OXYCODONE-ACETAMINOPHEN 5-325 MG PO TABS: 2.0000 | ORAL_TABLET | ORAL | Status: DC | PRN

## 2015-07-20 MED ORDER — ONDANSETRON HCL 4 MG/2ML IJ SOLN: 4.0000 mg | Freq: Four times a day (QID) | INTRAMUSCULAR | Status: DC | PRN

## 2015-07-20 MED ORDER — OXYTOCIN 40 UNITS IN LACTATED RINGERS INFUSION - SIMPLE MED
62.5000 mL/h | INTRAVENOUS | Status: DC
  Filled 2015-07-20: qty 1000

## 2015-07-20 MED ORDER — LIDOCAINE HCL (PF) 1 % IJ SOLN
INTRAMUSCULAR | Status: DC | PRN
  Administered 2015-07-20 (×2): 4 mL

## 2015-07-20 MED ORDER — CITRIC ACID-SODIUM CITRATE 334-500 MG/5ML PO SOLN: 30.0000 mL | ORAL | Status: DC | PRN

## 2015-07-20 MED ORDER — DIPHENHYDRAMINE HCL 25 MG PO CAPS
25.0000 mg | ORAL_CAPSULE | Freq: Four times a day (QID) | ORAL | Status: DC | PRN
Start: 2015-07-20 — End: 2015-07-22

## 2015-07-20 MED ORDER — DIBUCAINE 1 % RE OINT
1.0000 "application " | TOPICAL_OINTMENT | RECTAL | Status: DC | PRN
  Administered 2015-07-21: 1 via RECTAL
  Filled 2015-07-20: qty 28

## 2015-07-20 MED ORDER — ONDANSETRON HCL 4 MG/2ML IJ SOLN: 4.0000 mg | INTRAMUSCULAR | Status: DC | PRN

## 2015-07-20 NOTE — MAU Note (Signed)
Pain and pressure since the morning, worse since 2:00 at MD today 2 cm, pink bleeding.

## 2015-07-20 NOTE — Progress Notes (Signed)
Pain medication offered, but patient declines. Would like to wait for cervical re-check.

## 2015-07-20 NOTE — Anesthesia Procedure Notes (Signed)
Epidural Patient location during procedure: OB  Staffing Anesthesiologist: Elexis Pollak Performed by: anesthesiologist   Preanesthetic Checklist Completed: patient identified, site marked, surgical consent, pre-op evaluation, timeout performed, IV checked, risks and benefits discussed and monitors and equipment checked  Epidural Patient position: sitting Prep: site prepped and draped and DuraPrep Patient monitoring: continuous pulse ox and blood pressure Approach: midline Location: L3-L4 Injection technique: LOR saline  Needle:  Needle type: Tuohy  Needle gauge: 17 G Needle length: 9 cm and 9 Needle insertion depth: 5 cm cm Catheter type: closed end flexible Catheter size: 19 Gauge Catheter at skin depth: 10 cm Test dose: negative  Assessment Events: blood not aspirated, injection not painful, no injection resistance, negative IV test and no paresthesia  Additional Notes Patient identified. Risks/Benefits/Options discussed with patient including but not limited to bleeding, infection, nerve damage, paralysis, failed block, incomplete pain control, headache, blood pressure changes, nausea, vomiting, reactions to medication both or allergic, itching and postpartum back pain. Confirmed with bedside nurse the patient's most recent platelet count. Confirmed with patient that they are not currently taking any anticoagulation, have any bleeding history or any family history of bleeding disorders. Patient expressed understanding and wished to proceed. All questions were answered. Sterile technique was used throughout the entire procedure. Please see nursing notes for vital signs. Test dose was given through epidural catheter and negative prior to continuing to dose epidural or start infusion. Warning signs of high block given to the patient including shortness of breath, tingling/numbness in hands, complete motor block, or any concerning symptoms with instructions to call for help. Patient was  given instructions on fall risk and not to get out of bed. All questions and concerns addressed with instructions to call with any issues or inadequate analgesia.      

## 2015-07-20 NOTE — Anesthesia Preprocedure Evaluation (Signed)
Anesthesia Evaluation  Patient identified by MRN, date of birth, ID band Patient awake    Reviewed: Allergy & Precautions, NPO status , Patient's Chart, lab work & pertinent test results  History of Anesthesia Complications Negative for: history of anesthetic complications  Airway Mallampati: II  TM Distance: >3 FB Neck ROM: Full    Dental no notable dental hx. (+) Dental Advisory Given   Pulmonary neg pulmonary ROS,  breath sounds clear to auscultation  Pulmonary exam normal       Cardiovascular negative cardio ROS Normal cardiovascular examRhythm:Regular Rate:Normal     Neuro/Psych negative neurological ROS  negative psych ROS   GI/Hepatic negative GI ROS, Neg liver ROS,   Endo/Other  negative endocrine ROS  Renal/GU negative Renal ROS  negative genitourinary   Musculoskeletal negative musculoskeletal ROS (+)   Abdominal   Peds negative pediatric ROS (+)  Hematology negative hematology ROS (+)   Anesthesia Other Findings   Reproductive/Obstetrics (+) Pregnancy                             Anesthesia Physical Anesthesia Plan  ASA: II  Anesthesia Plan: Epidural   Post-op Pain Management:    Induction:   Airway Management Planned:   Additional Equipment:   Intra-op Plan:   Post-operative Plan:   Informed Consent: I have reviewed the patients History and Physical, chart, labs and discussed the procedure including the risks, benefits and alternatives for the proposed anesthesia with the patient or authorized representative who has indicated his/her understanding and acceptance.   Dental advisory given  Plan Discussed with: CRNA  Anesthesia Plan Comments:         Anesthesia Quick Evaluation  

## 2015-07-20 NOTE — H&P (Signed)
Cynthia Mccoy is a 29 y.o. female presenting for labor sxs.   Seen in office today and was 2 cm present this evening with ctxs q 2 minutes and Cx change to 4+ Pregnancy complicated by fetal neck mass followed by MFM and appeared to be stable.  Also LGA.  GBS-. History OB History    Gravida Para Term Preterm AB TAB SAB Ectopic Multiple Living   0 0 0 0 0 0 2     Past Medical History  Diagnosis Date  . Medical history non-contributory    Past Surgical History  Procedure Laterality Date  . No past surgeries     Family History: family history includes Cancer in her maternal grandfather; Diabetes in her paternal grandfather and paternal grandmother; Hypertension in her maternal grandmother, paternal grandfather, and paternal grandmother. Social History:  reports that she has never smoked. She does not have any smokeless tobacco history on file. She reports that she does not drink alcohol or use illicit drugs.   Prenatal Transfer Tool  Maternal Diabetes: No Genetic Screening: Normal Maternal Ultrasounds/Referrals: Normal Fetal Ultrasounds or other Referrals:  Referred to Materal Fetal Medicine Fetal neck/chest mass for post delivery eval and imaging per peds Maternal Substance Abuse:  No Significant Maternal Medications:  None Significant Maternal Lab Results:  None Other Comments:  None  ROS  Dilation: 4.5 Effacement (%): 80 Station: -2 Exam by:: S. Carrera, RNC Blood pressure 120/64, pulse 81, temperature 98.4 F (36.9 C), temperature source Oral, resp. rate 16, height  (1.626 m), weight 166 lb 9.6 oz (75.569 kg), last menstrual period 10/15/2014, unknown if currently breastfeeding. Exam Physical Exam  Prenatal labs: ABO, Rh: --/--/B POS (08/31 1900) Antibody: PENDING (08/31 1900) Rubella: Nonimmune (02/04 0000) RPR: Nonreactive (02/04 0000)  HBsAg: Negative (02/04 0000)  HIV: Non-reactive (02/04 0000)  GBS: Negative (08/05 0000)   Assessment/Plan: IUP at  term Active labor Anticipate SVD.  Epidural now Peds for eval of neck mass after delivery   Efton Thomley C 07/20/2015, 7:51 PM

## 2015-07-21 LAB — CBC
HEMATOCRIT: 35.3 % — AB (ref 36.0–46.0)
HEMOGLOBIN: 11.6 g/dL — AB (ref 12.0–15.0)
MCH: 29.4 pg (ref 26.0–34.0)
MCHC: 32.9 g/dL (ref 30.0–36.0)
MCV: 89.4 fL (ref 78.0–100.0)
Platelets: 184 10*3/uL (ref 150–400)
RBC: 3.95 MIL/uL (ref 3.87–5.11)
RDW: 14.5 % (ref 11.5–15.5)
WBC: 15.5 10*3/uL — AB (ref 4.0–10.5)

## 2015-07-21 LAB — RPR: RPR: NONREACTIVE

## 2015-07-21 LAB — HIV ANTIBODY (ROUTINE TESTING W REFLEX): HIV Screen 4th Generation wRfx: NONREACTIVE

## 2015-07-21 NOTE — Progress Notes (Signed)
Post Partum Day 1 Subjective: no complaints, up ad lib, voiding and tolerating PO  Objective: Blood pressure 100/57, pulse 80, temperature 97.9 F (36.6 C), temperature source Oral, resp. rate 20, height  (1.626 m), weight 166 lb 14.4 oz (75.705 kg), last menstrual period 10/15/2014, SpO2 100 %, unknown if currently breastfeeding.  Physical Exam:  General: alert and cooperative Lochia: appropriate Uterine Fundus: firm Incision: healing well DVT Evaluation: No evidence of DVT seen on physical exam. Negative Homan's sign. No cords or calf tenderness. No significant calf/ankle edema.   Recent Labs  07/20/15 1900 07/21/15 0528  HGB 13.0 11.6*  HCT 38.6 35.3*    Assessment/Plan: Plan for discharge tomorrow   LOS: 1 day   CURTIS,CAROL G 07/21/2015, 8:26 AM

## 2015-07-21 NOTE — Lactation Note (Signed)
This note was copied from the chart of Cynthia Mccoy. Lactation Consultation Note; Experienced BF mom reports baby has been latching well. Several visitors present, holding sleeping baby. No questions at present. BF brochure given with resources for support after DC. To call prn  Patient Name: Cynthia Mccoy [id number] Date: 07/21/2015 Reason for consult: Initial assessment   Maternal Data Formula Feeding for Exclusion: No Does the patient have breastfeeding experience prior to this delivery?: Yes  Feeding    LATCH Score/Interventions Latch: Grasps breast easily, tongue down, lips flanged, rhythmical sucking.  Audible Swallowing: Spontaneous and intermittent Intervention(s): Skin to skin  Type of Nipple: Everted at rest and after stimulation  Comfort (Breast/Nipple): Filling, red/small blisters or bruises, mild/mod discomfort  Problem noted: Mild/Moderate discomfort  Hold (Positioning): Assistance needed to correctly position infant at breast and maintain latch.  LATCH Score: 8  Lactation Tools Discussed/Used     Consult Status Consult Status: PRN    Pamelia Hoit 07/21/2015, 2:43 PM

## 2015-07-21 NOTE — Anesthesia Postprocedure Evaluation (Signed)
  Anesthesia Post-op Note  Patient: Cynthia Mccoy  Procedure(s) Performed: * No procedures listed *  Patient Location: Mother/Baby  Anesthesia Type:Epidural  Level of Consciousness: awake  Airway and Oxygen Therapy: Patient Spontanous Breathing  Post-op Pain: mild  Post-op Assessment: Patient's Cardiovascular Status Stable and Respiratory Function Stable              Post-op Vital Signs: stable  Last Vitals:  Filed Vitals:   07/21/15 0621  BP: 100/57  Pulse: 80  Temp: 36.6 C  Resp: 20    Complications: No apparent anesthesia complications

## 2015-07-22 NOTE — Discharge Summary (Signed)
Obstetric Discharge Summary Reason for Admission: onset of labor Prenatal Procedures: ultrasound Intrapartum Procedures: spontaneous vaginal delivery Postpartum Procedures: none Complications-Operative and Postpartum: none HEMOGLOBIN  Date Value Ref Range Status  07/21/2015 11.6* 12.0 - 15.0 g/dL Final   HCT  Date Value Ref Range Status  07/21/2015 35.3* 36.0 - 46.0 % Final    Physical Exam:  General: alert and cooperative Lochia: appropriate Uterine Fundus: firm Incision: perineum intact DVT Evaluation: No evidence of DVT seen on physical exam. Negative Homan's sign. No cords or calf tenderness. No significant calf/ankle edema.  Discharge Diagnoses: Term Pregnancy-delivered  Discharge Information: Date: 07/22/2015 Activity: pelvic rest Diet: routine Medications: PNV and Ibuprofen Condition: stable Instructions: refer to practice specific booklet Discharge to: home   Newborn Data: Live born female  Birth Weight: 8 lb 2.9 oz (3710 g) APGAR: 9, 9  Home with mother.  CURTIS,CAROL G 07/22/2015, 8:26 AM

## 2015-07-28 ENCOUNTER — Inpatient Hospital Stay (HOSPITAL_COMMUNITY): Payer: Managed Care, Other (non HMO)

## 2020-01-29 ENCOUNTER — Ambulatory Visit: Payer: BC Managed Care – PPO | Attending: Internal Medicine

## 2020-01-29 DIAGNOSIS — Z23 Encounter for immunization: Secondary | ICD-10-CM

## 2020-01-29 NOTE — Progress Notes (Signed)
   Covid-[facility] Vaccination [facility]  Name:  Cynthia Mccoy    MRN: [id number] DOB: 1986-10-12  01/29/2020  [name] was observed post Covid-19 immunization for 15 minutes without incident. She was provided with Vaccine Information Sheet and instruction to access the V-Safe system.   [name] was instructed to call 911 with any severe reactions post vaccine: Marland Kitchen Difficulty breathing  . Swelling of face and throat  . A fast heartbeat  . A bad rash all over body  . Dizziness and weakness   Immunizations Administered    Name Date Dose VIS Date Route   Pfizer COVID-19 Vaccine 01/29/2020 10:07 AM 0.3 mL 10/30/2019 Intramuscular   Manufacturer: [employer], [employer]   Lot: [id number]   NDC: [address]-6

## 2020-02-24 ENCOUNTER — Ambulatory Visit: Payer: BC Managed Care – PPO | Attending: Internal Medicine

## 2020-02-24 DIAGNOSIS — Z23 Encounter for immunization: Secondary | ICD-10-CM

## 2020-02-24 NOTE — Progress Notes (Signed)
   Covid-19 Vaccination Clinic  Name:  ELIOT BENCIVENGA    MRN: [id number] DOB: March 14, 1986  02/24/2020  Ms. Boney was observed post Covid-19 immunization for 15 minutes without incident. She was provided with Vaccine Information Sheet and instruction to access the V-Safe system.   Ms. Pretlow was instructed to call 911 with any severe reactions post vaccine: Marland Kitchen Difficulty breathing  . Swelling of face and throat  . A fast heartbeat  . A bad rash all over body  . Dizziness and weakness   Immunizations Administered    Name Date Dose VIS Date Route   Pfizer COVID-19 Vaccine 02/24/2020 10:01 AM 0.3 mL 10/30/2019 Intramuscular   Manufacturer: [employer], [employer]   Lot: [id number]   NDC: [address]-0
